# Patient Record
Sex: Female | Born: 1970 | Hispanic: No | Marital: Married | Smoking: Never smoker
Health system: Southern US, Community
[De-identification: ages and names within clinical notes are randomized; demographics above are authoritative.]

## PROBLEM LIST (undated history)

## (undated) ENCOUNTER — Emergency Department (HOSPITAL_COMMUNITY): Admission: EM | Discharge status: Absent without leave | Payer: 59

## (undated) DIAGNOSIS — Z9109 Other allergy status, other than to drugs and biological substances: Secondary | ICD-10-CM

## (undated) DIAGNOSIS — N951 Menopausal and female climacteric states: Secondary | ICD-10-CM

## (undated) DIAGNOSIS — E785 Hyperlipidemia, unspecified: Secondary | ICD-10-CM

## (undated) HISTORY — DX: Hyperlipidemia, unspecified: E78.5

## (undated) HISTORY — DX: Other allergy status, other than to drugs and biological substances: Z91.09

## (undated) HISTORY — DX: Menopausal and female climacteric states: N95.1

## (undated) HISTORY — PX: HERNIA REPAIR: SHX51

---

## 2002-01-31 ENCOUNTER — Emergency Department (HOSPITAL_COMMUNITY): Admission: EM | Admit: 2002-01-31 | Discharge: 2002-01-31 | Payer: Self-pay | Admitting: Emergency Medicine

## 2002-02-02 ENCOUNTER — Other Ambulatory Visit: Admission: RE | Admit: 2002-02-02 | Discharge: 2002-02-02 | Payer: Self-pay | Admitting: Obstetrics and Gynecology

## 2002-08-11 ENCOUNTER — Ambulatory Visit (HOSPITAL_COMMUNITY): Admission: RE | Admit: 2002-08-11 | Discharge: 2002-08-11 | Payer: Self-pay | Admitting: Obstetrics and Gynecology

## 2002-08-12 ENCOUNTER — Inpatient Hospital Stay (HOSPITAL_COMMUNITY): Admission: RE | Admit: 2002-08-12 | Discharge: 2002-08-14 | Payer: Self-pay | Admitting: Obstetrics and Gynecology

## 2003-12-13 ENCOUNTER — Other Ambulatory Visit: Admission: RE | Admit: 2003-12-13 | Discharge: 2003-12-13 | Payer: Self-pay

## 2006-10-03 ENCOUNTER — Inpatient Hospital Stay (HOSPITAL_COMMUNITY): Admission: RE | Admit: 2006-10-03 | Discharge: 2006-10-05 | Payer: Self-pay | Admitting: Obstetrics and Gynecology

## 2006-10-03 ENCOUNTER — Encounter (INDEPENDENT_AMBULATORY_CARE_PROVIDER_SITE_OTHER): Payer: Self-pay | Admitting: Specialist

## 2009-09-13 ENCOUNTER — Ambulatory Visit (HOSPITAL_COMMUNITY): Admission: RE | Admit: 2009-09-13 | Discharge: 2009-09-13 | Payer: Self-pay | Admitting: Family Medicine

## 2010-08-05 ENCOUNTER — Emergency Department (HOSPITAL_COMMUNITY): Admission: EM | Admit: 2010-08-05 | Discharge: 2010-08-05 | Payer: Self-pay | Admitting: Emergency Medicine

## 2011-05-03 NOTE — Op Note (Signed)
NAME:  LYNE, KHURANA         ACCOUNT NO.:  [id number]   MEDICAL RECORD NO.:  [id number]          PATIENT TYPE:  INP   LOCATION:  A412                          FACILITY:  [facility]   PHYSICIAN:  Tilda Burrow, M.D. DATE OF BIRTH:  1971-05-03   DATE OF PROCEDURE:  10/03/2006  DATE OF DISCHARGE:                                 OPERATIVE REPORT   PREOPERATIVE DIAGNOSES:  Pregnancy at 39 weeks, repeat cesarean section, not  for trial of labor, elective permanent sterilization, umbilical hernia.   POSTOPERATIVE DIAGNOSES:  Pregnancy at 39 weeks, repeat cesarean section,  not for trial of labor, elective permanent sterilization, umbilical hernia,  delivered and with umbilical hernia repaired.   PROCEDURE:  Repeat repeat low transverse cervical cesarean section,  bilateral partial salpingectomy and umbilical herniorrhaphy.   SURGEON:  Tilda Burrow, M.D.   ASSISTANTAlinda Money RN   ANESTHESIA:  Spinal.   COMPLICATIONS:  None.   FINDINGS:  Healthy female infant, Apgars 9 and 9.   INDICATIONS:  A 40 year old multiparous Hispanic female with repeat cesarean  section scheduled at term.   DETAILS OF PROCEDURE:  The patient was taken operating room.  Spinal  anesthesia introduced.  Lower abdomen prepped and draped for surgery with  Pfannenstiel incision repeated with wide excision of cicatrix to address the  old scar.  The abdomen was easily entered through the midline.  Bladder flap  developed on the lower uterine segment which was quite thin at the lower  portions of the bladder flap.  Retraction and transverse uterine incision  performed, extended laterally using index finger traction with rotation of  the vertex into the incision and delivered the infant using fundal pressure.  Infant delivered nicely.  Cord was clamped and infant placed in Dr.  Samul Dada .  The cord blood gases were obtained as well as routine samples  then the uterus irrigated with saline solution and closed  with a running  locking closure.  The patient received Cleocin 300 mg IV for prophylaxis.  The bladder flap was satisfactorily hemostatic and so we closed the bladder  flap with 2-0 chromic.   Tubal ligation:  Tubal ligation was performed by identifying a mid segment  knuckle of each tube, doubly ligating around its pedicle, excising the  incarcerated knuckle of tube for histology.  Each tube was sent separately  and the left tube tagged for future identification.   Peritoneal closure then followed with sponge and needle counts correct.  The  fascia was trimmed to improve the lower abdominal contours of approximately  1 inch of extra tissues and then the fascia closed with a running locking  zero Vicryl.  The subcutaneous tissues easily reapproximated with  interrupted 2-0 plain sutures.  There was no bleeding in this area.  Staple  closure of skin completed the procedure.   Umbilical herniorrhaphy.  Umbilical hernia repair was then performed by  identifying the skin over the hernia, pulling the umbilicus caudad, making a  semicircular incision around the bottom one-third of the umbilicus,  dissecting down and identifying the rim of the hernia defect.  We did not  actually  enter the peritoneal cavity.  Two Allis clamps were used on the  upper and lower aspects of the hernia defect.  The lateral aspects of the  hernia defect were trimmed to allow a more uniform closure and then we  closed the close the defect with a running 2-0 Prolene pulled together with  a continuous running fashion without difficulty.  The tips of the suture  were inverted underneath the fascia so that we did not deal with umbilical  skin irritation.  Subcutaneous tissues were reapproximated using 2-0 chromic  after the skin edges were trimmed for improved appearance and then staple  closure of the skin completed the procedure.  The patient the tolerated  procedure well and went to the recovery room in good  condition.  Estimated  blood loss 500 mL.      Tilda Burrow, M.D.  Electronically Signed     JVF/MEDQ  D:  10/03/2006  T:  10/05/2006  Job:  [id number]

## 2011-05-03 NOTE — Discharge Summary (Signed)
NAME:  Mary Gallagher, Mary Gallagher         ACCOUNT NO.:  [id number]   MEDICAL RECORD NO.:  [id number]          PATIENT TYPE:  INP   LOCATION:  A412                          FACILITY:  [facility]   PHYSICIAN:  Lazaro Arms, M.D.   DATE OF BIRTH:  02-24-1971   DATE OF ADMISSION:  10/03/2006  DATE OF DISCHARGE:  10/21/2007LH                                 DISCHARGE SUMMARY   DISCHARGE DIAGNOSES:  1. Status post a repeat cesarean section with bilateral tubal ligation,      repair of umbilical hernia.  2. Unremarkable postoperative course.   PROCEDURES:  As above.   Please refer to the history and physical and the operative report for  details on admission to the hospital.   HOSPITAL COURSE:  The patient was admitted.  After surgery, she did quite  well.  She tolerated clear liquids and a regular diet.  She voided without  symptoms.  She was extensively ambulatory.  Her incision was clean, dry, and  intact.  Her postoperative day one hemoglobin was 12 and 34.  She had  already transitioned to oral pain medicine.  She was discharged to home on  the morning of postoperative day two in good, stable condition, to follow up  in the office on Friday to have her staples of her cesarean and umbilical  hernia repair removed.  She was given Tylox #30 and Motrin #20 to take home  for pain management.  She was given instructions and precautions for calling  back prior to her scheduled visit.      Lazaro Arms, M.D.  Electronically Signed     LHE/MEDQ  D:  10/05/2006  T:  10/05/2006  Job:  [id number]

## 2011-05-03 NOTE — H&P (Signed)
NAMEMARYLYNN, Mary Gallagher         ACCOUNT NO.:  [id number]   MEDICAL RECORD NO.:  [id number]          PATIENT TYPE:  INP   LOCATION:                                FACILITY:  [facility]   PHYSICIAN:  Tilda Burrow, M.D. DATE OF BIRTH:  1971/06/16   DATE OF ADMISSION:  DATE OF DISCHARGE:  LH                                HISTORY & PHYSICAL   ADMISSION DIAGNOSES:  1. Pregnancy, 38-1/2 weeks' gestation.  2. Repeat cesarean section after trial of labor.  3. Desire for elective permanent sterilization.  4. Umbilical hernia.   HISTORY OF PRESENT ILLNESS:  This 40 year old female, gravida 2, para 1, AB  0, LMP February 02, 2006, placing [facility] November 09, 2006, placing with  ultrasound, assigned [facility] matching also on October 25 and October 28 and  October 14, 2006, was admitted at 39 weeks by menstrual criteria and 38+ by  ultrasound criteria for repeat cesarean section and tubal ligation with  additional plans for umbilical hernia.  She has been seen in our office  through pregnancy with initial visit at 8 weeks' gestation.  Prenatal course  was relatively uneventful.  She does have an umbilical hernia that is  symptomatic and she wishes corrected.  She has also specifically stated  desire for permanent sterilization.  She understands that it is permanent,  with discussion in both [place] and [place].   PAST MEDICAL HISTORY:  Benign.  Surgically-assisted Cesarean.   ALLERGIES:  1. PENICILLIN.  2. TYLOX.   SOCIAL HISTORY:  She lives with the baby's father, Mary Gallagher.   PHYSICAL EXAMINATION:  GENERAL:  Shows a healthy, slim, Hispanic female.  VITAL SIGNS:  Weight 153, blood pressure 140/60.  ABDOMEN:  Fundal height term, fetus 6 pound estimated fetal weight with  small 2-3 cm umbilical hernia.  Lower abdominal incision is well-healed.  GENITALIA:  External genitalia normal.   LABORATORY DATA:  For pregnancy, include blood type A positive.  Urine drug  screen negative.  Rubella  immune.  Hemoglobin 13, hematocrit 38.  Hepatitis,  HIV, RPR, and GC/Chlamydia were negative.  Group B Strep negative.  _Glucose  Tol Test__ negative.   PLAN:  Repeat C-section, tubal ligation, umbilical hernia on October 03, 2006.      Tilda Burrow, M.D.  Electronically Signed     JVF/MEDQ  D:  10/02/2006  T:  10/03/2006  Job:  [id number]

## 2011-05-03 NOTE — Op Note (Signed)
NAME:  Mary Gallagher, Mary Gallagher         ACCOUNT NO.:  [id number]   MEDICAL RECORD NO.:  [id number]          PATIENT TYPE:  INP   LOCATION:  A412                          FACILITY:  [facility]   PHYSICIAN:  Tilda Burrow, M.D. DATE OF BIRTH:  05/23/1971   DATE OF PROCEDURE:  10/03/2006  DATE OF DISCHARGE:  10/05/2006                               OPERATIVE REPORT   PREOPERATIVE DIAGNOSIS:  Pregnancy at 38-1/[redacted] weeks gestation.  Repeat  cesarean section, not for trial of labor.  Desire for elective permanent  sterilization.  Umbilical hernia.   POSTOPERATIVE DIAGNOSIS:  Pregnancy at 38-1/[redacted] weeks gestation.  Repeat  cesarean section, not for trial of labor.  Desire for elective permanent  sterilization.  Umbilical hernia.   PROCEDURE:  Repeat low transverse cervical cesarean section, bilateral  tubal ligation, umbilical herniorrhaphy.   SURGEON:  Tilda Burrow, M.D.   ASSISTANT:  None.   ANESTHESIA:  Spinal.   COMPLICATIONS:  None.   FINDINGS:  Healthy infant.  Apgars 9 and 9.   DESCRIPTION OF PROCEDURE:  Patient was taken to the operating room.  Anesthesia introduced, spinal.  Abdomen prepped and draped.  A  transverse lower abdominal incision was repeated in the method of  Pfannenstiel with wide excision of the old cicatrix.  The peritoneal  cavity was entered in the midline.  Bladder flap developed.  A  transverse lower uterine segment incision made.  Fetal vertex rotated  through the incision and delivered easily with fundal pressure.  The  cord was clamped, and the infant passed to the waiting pediatrician, Dr.  Milford Cage.   The uterus was irrigated with antibiotic solution and closed in a single-  layer, running, locking closure of 2-0 chromic, and bladder flap  reapproximated with 2-0 chromic.   TUBAL LIGATION:  The tubal ligation was performed by incarcerated a mid  segment knuckle of tube with 2-0 chromic suture and exercising the  incarcerated knuckle of tube.  Hemostasis  was good.  Specimens were  tagged for separate identification.   The procedure was continued by closing the anterior peritoneum, then  trimming the fascia slightly, pulling it together with continuous,  running 0 Vicryl.  Closing the subcutaneous fatty space, potential space  with 2-0 plain and staple closure of the skin.  The patient tolerated  the procedure well.   ADDENDUM:  The umbilical hernia was then addressed.  We first made a  semi-circular incision over the inferior portions of the incision from 4  o'clock position to 8 o'clock, dissected down to the fascial defect, and  inverted the hernia sac down into the abdomen.  It was not necessary to  open the hernia sac.  The fascia was pulled together side-to-side using  continuous running 2-0 Prolene suture with good approximation of the  fascial edges.  The subcutaneous tissue was pulled together with  interrupted 3-0 plain, and simple closure of the skin completed the  procedure, as staples had closed the Pfannenstiel incision.  Patient  tolerated the procedure well, went to the recovery room in stable  condition.      Tilda Burrow, M.D.  Electronically  Signed     JVF/MEDQ  D:  11/05/2006  T:  11/05/2006  Job:  [id number]

## 2011-05-03 NOTE — Discharge Summary (Signed)
   NAME:  Mary, Gallagher                       ACCOUNT NO.:  [id number]   MEDICAL RECORD NO.:  [id number]                   PATIENT TYPE:  INP   LOCATION:  A417                                 FACILITY:  [facility]   PHYSICIAN:  Lazaro Arms, M.D.                DATE OF BIRTH:  04/04/1971   DATE OF ADMISSION:  08/12/2002  DATE OF DISCHARGE:  08/14/2002                                 DISCHARGE SUMMARY   DISCHARGE DIAGNOSES:  1. Status post a primary low transverse cesarean section.  2. Breech presentation.  3. Labor.   PROCEDURE:  As above.   HISTORY OF PRESENT ILLNESS:  Please refer to the history and physical for  details of admission to the hospital.   HOSPITAL COURSE:  The patient was admitted in early labor.  She was breech,  supposed to undergo a cesarean section the following day as scheduled, but  came in in labor.  She delivered an 8-pound 13-ounce female.  Surgery was  unremarkable.  Her postoperative course was unremarkable.  She remained  afebrile, was tolerating clear liquids and a regular diet.  Voided without  symptoms.  Tolerated transition from IV to oral pain medicine.  She was  ambulatory.  Her incision was clean, dry, and intact.  Her H&H was 10.9 and  38.1.  she was discharged to home on the morning of postoperative day #2 in  good and stable condition.  To follow up in the office on Wednesday to have  her staples removed.                                               Lazaro Arms, M.D.    [name]  D:  08/14/2002  T:  08/14/2002  Job:  [id number]

## 2011-05-03 NOTE — Op Note (Signed)
NAME:  Mary Gallagher, Mary Gallagher                       ACCOUNT NO.:  [id number]   MEDICAL RECORD NO.:  [id number]                   PATIENT TYPE:  INP   LOCATION:  A417                                 FACILITY:  [facility]   PHYSICIAN:  Tilda Burrow, M.D.              DATE OF BIRTH:  01/07/1971   DATE OF PROCEDURE:  08/13/2002  DATE OF DISCHARGE:                                 OPERATIVE REPORT   PREOPERATIVE DIAGNOSIS:  Pregnancy [redacted] weeks gestation, early labor, breech  presentation.   POSTOPERATIVE DIAGNOSIS:  Pregnancy [redacted] weeks gestation, early labor, breech  presentation.   PROCEDURE:  Primary low transverse cervical cesarean section.   SURGEON:  Tilda Burrow, M.D.   ASSISTANT:  None.   ANESTHESIA:  Spinal.   COMPLICATIONS:  None.   PEDIATRICIAN:  Francoise Schaumann. Halm, D.O.   FINDINGS:  8 pound ____ ounce female infant, Apgars 8 and 9.   DESCRIPTION OF PROCEDURE:  The patient was taken to the operating room and  spinal anesthesia introduced.  The abdomen was prepped and draped with a  Foley catheter in place with a Foley catheter in place.  A Pfannenstiel-type  incision was performed once analgesia was confirmed and the easy development  of the bladder flap was achieved.   A transverse nick was made in the uterus during the thinned out lower  uterine segment.  The cut was over the area of the buttock.  There was a  minimal contact with the infant enough to allow a droplet of blood from the  thigh without breaking the infant's skin completely.  The delivery of the  infant was performed by delivery of the buttocks applying fundal pressure  and delivering the legs up to the knees which the hips could be flexed and  the legs delivered without extension.   The body was delivered up to the shoulders, whereupon the arms were swept in  front of the body without difficulty, and then the Mauriceau maneuver used  to guide the vertex out with the finger in the infant's mouth to guide  the  vertex.  Bulb suctioning was performed promptly upon access to the pharynx,  and actually it was clear.  The infant's cord was clamped and the infant  passed to the waiting pediatrician.  The infant's care is dictated by Dr.  Milford Cage.   Cord blood samples were obtained and placenta delivered [facility]  presentation.  The patient received IV antibiotics consisting of Cleocin  intravenously administered due to her penicillin allergy.   The uterus was irrigated with saline solution.  A single layer of running  locking closure performed and then 2-0 chromic closure of the bladder flap  performed.  The peritoneum was closed using 2-0 chromic as well after  irrigation of the abdomen.  The fascia was closed using #0 Vicryl in a  running fashion.  Subcutaneous tissues were reapproximated using interrupted  2-0 plain and  then staple clips for the skin completed the procedure.  The  patient tolerated the procedure well and went to recovery room in good  condition with estimated blood loss 400 cc.                                               Tilda Burrow, M.D.    JVF/MEDQ  D:  08/12/2002  T:  08/14/2002  Job:  [id number]   cc:   [facility] Department   Francoise Schaumann. Halm, D.O.

## 2011-05-08 ENCOUNTER — Other Ambulatory Visit (HOSPITAL_COMMUNITY): Payer: Self-pay | Admitting: Family Medicine

## 2011-05-08 DIAGNOSIS — Z139 Encounter for screening, unspecified: Secondary | ICD-10-CM

## 2011-05-10 ENCOUNTER — Ambulatory Visit (HOSPITAL_COMMUNITY)
Admission: RE | Admit: 2011-05-10 | Discharge: 2011-05-10 | Disposition: A | Discharge status: Home / Self Care | Payer: Self-pay | Source: Ambulatory Visit | Attending: Family Medicine | Admitting: Family Medicine

## 2011-05-10 DIAGNOSIS — Z139 Encounter for screening, unspecified: Secondary | ICD-10-CM

## 2012-09-28 ENCOUNTER — Other Ambulatory Visit (HOSPITAL_COMMUNITY): Payer: Self-pay | Admitting: *Deleted

## 2012-09-28 DIAGNOSIS — Z139 Encounter for screening, unspecified: Secondary | ICD-10-CM

## 2012-10-05 ENCOUNTER — Ambulatory Visit (HOSPITAL_COMMUNITY)
Admission: RE | Admit: 2012-10-05 | Discharge: 2012-10-05 | Disposition: A | Discharge status: Home / Self Care | Payer: PRIVATE HEALTH INSURANCE | Source: Ambulatory Visit | Attending: *Deleted | Admitting: *Deleted

## 2012-10-05 DIAGNOSIS — Z139 Encounter for screening, unspecified: Secondary | ICD-10-CM

## 2012-10-05 DIAGNOSIS — Z1231 Encounter for screening mammogram for malignant neoplasm of breast: Secondary | ICD-10-CM | POA: Insufficient documentation

## 2013-11-16 ENCOUNTER — Other Ambulatory Visit (HOSPITAL_COMMUNITY): Payer: Self-pay | Admitting: Physician Assistant

## 2013-11-16 DIAGNOSIS — Z139 Encounter for screening, unspecified: Secondary | ICD-10-CM

## 2013-11-30 ENCOUNTER — Ambulatory Visit (HOSPITAL_COMMUNITY)
Admission: RE | Admit: 2013-11-30 | Discharge: 2013-11-30 | Disposition: A | Discharge status: Home / Self Care | Payer: Self-pay | Source: Ambulatory Visit | Attending: Physician Assistant | Admitting: Physician Assistant

## 2013-11-30 DIAGNOSIS — Z139 Encounter for screening, unspecified: Secondary | ICD-10-CM

## 2014-12-26 ENCOUNTER — Other Ambulatory Visit (HOSPITAL_COMMUNITY): Payer: Self-pay | Admitting: Physician Assistant

## 2014-12-26 DIAGNOSIS — Z1231 Encounter for screening mammogram for malignant neoplasm of breast: Secondary | ICD-10-CM

## 2015-01-12 ENCOUNTER — Ambulatory Visit (HOSPITAL_COMMUNITY)
Admission: RE | Admit: 2015-01-12 | Discharge: 2015-01-12 | Disposition: A | Discharge status: Home / Self Care | Payer: Self-pay | Source: Ambulatory Visit | Attending: Physician Assistant | Admitting: Physician Assistant

## 2015-01-12 DIAGNOSIS — Z1231 Encounter for screening mammogram for malignant neoplasm of breast: Secondary | ICD-10-CM

## 2015-01-13 ENCOUNTER — Other Ambulatory Visit: Payer: Self-pay | Admitting: Physician Assistant

## 2015-01-13 DIAGNOSIS — R928 Other abnormal and inconclusive findings on diagnostic imaging of breast: Secondary | ICD-10-CM

## 2015-01-31 ENCOUNTER — Ambulatory Visit (HOSPITAL_COMMUNITY)
Admission: RE | Admit: 2015-01-31 | Discharge: 2015-01-31 | Disposition: A | Discharge status: Home / Self Care | Payer: 59 | Source: Ambulatory Visit | Attending: Physician Assistant | Admitting: Physician Assistant

## 2015-01-31 DIAGNOSIS — R928 Other abnormal and inconclusive findings on diagnostic imaging of breast: Secondary | ICD-10-CM | POA: Insufficient documentation

## 2015-02-07 ENCOUNTER — Encounter (HOSPITAL_COMMUNITY): Payer: Self-pay

## 2015-08-02 IMAGING — MG MM DIGITAL SCREENING
3 series · 3 of 3 positions shown · non-contrast
Comparison: Previous exam(s).

CLINICAL DATA: Screening.

EXAM:
DIGITAL SCREENING BILATERAL MAMMOGRAM WITH CAD

[L CC]
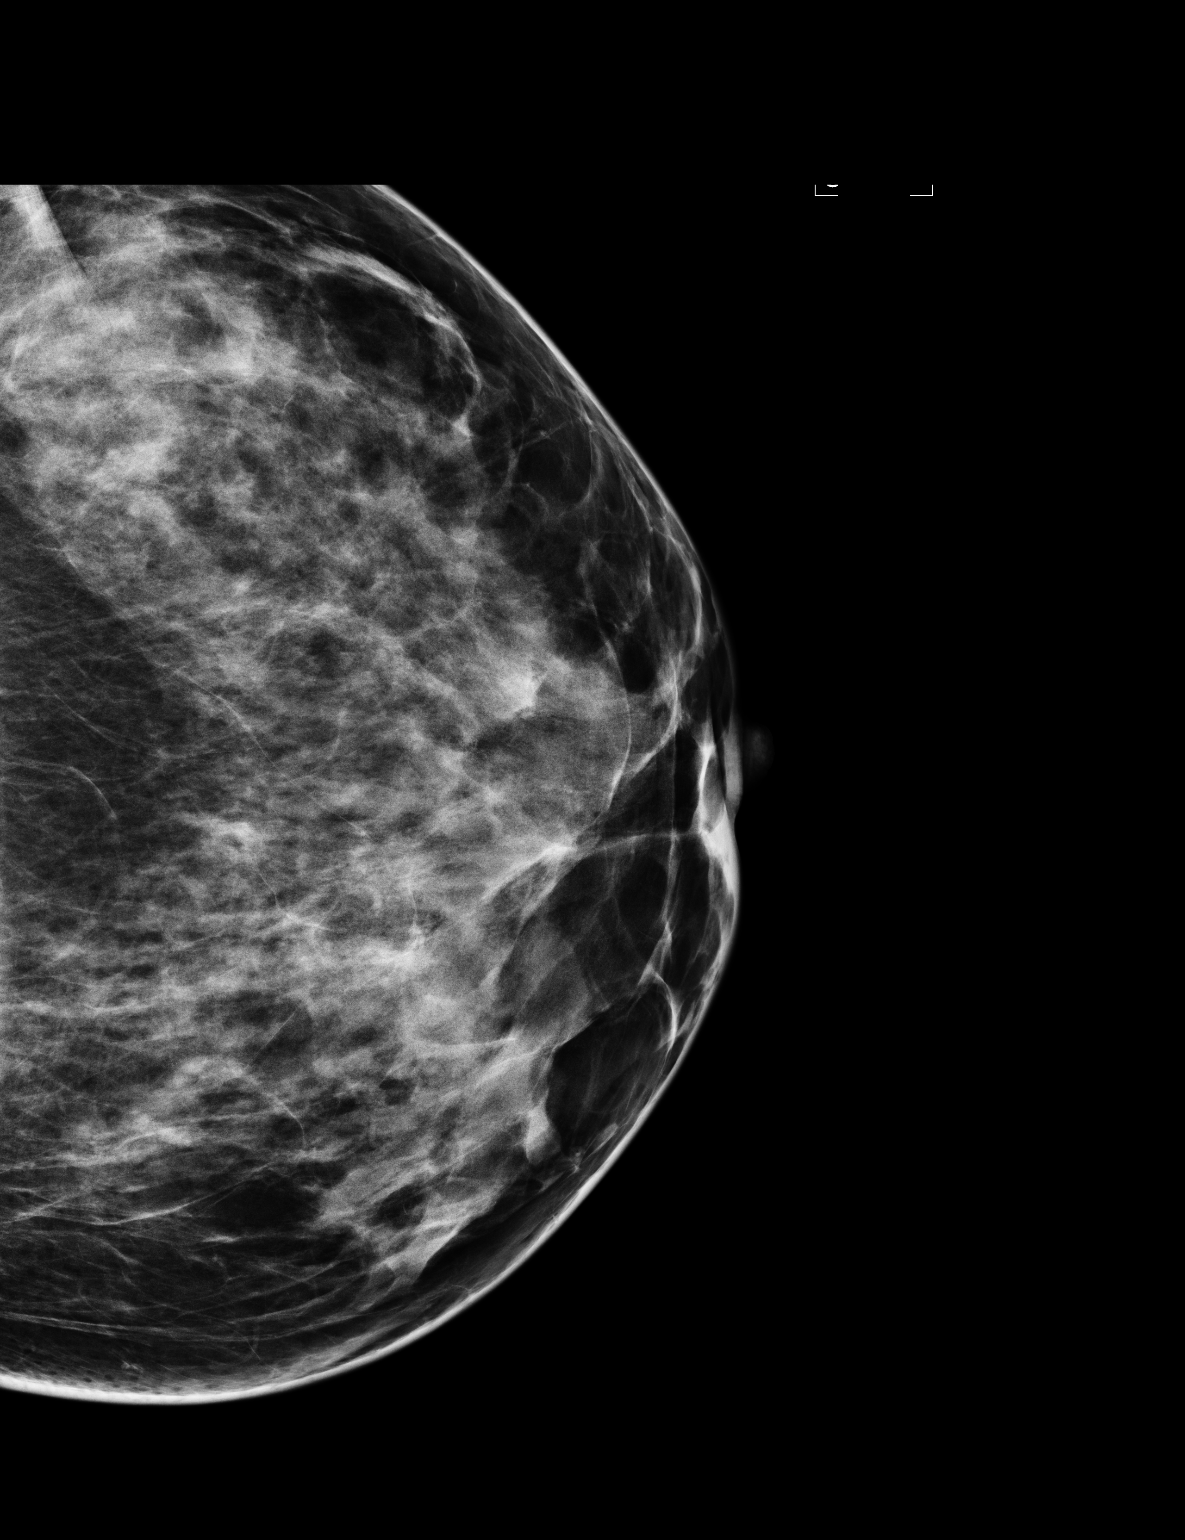

[R CC]
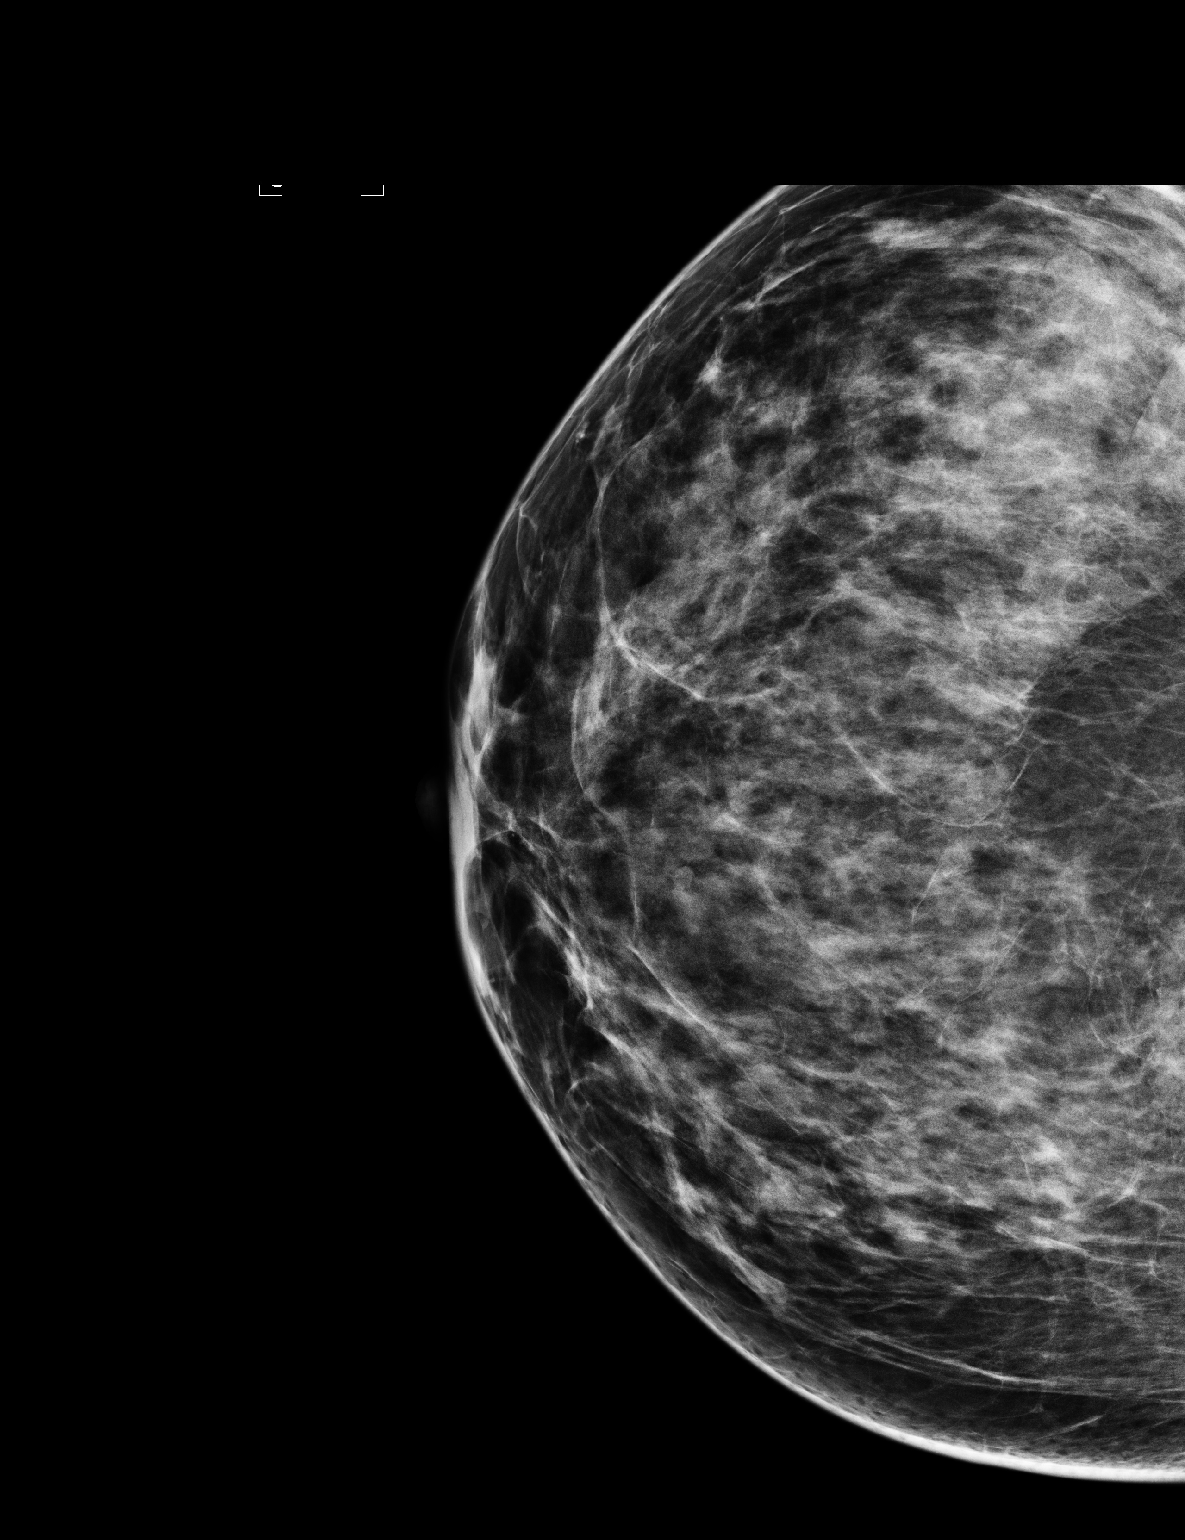

[R MLO]
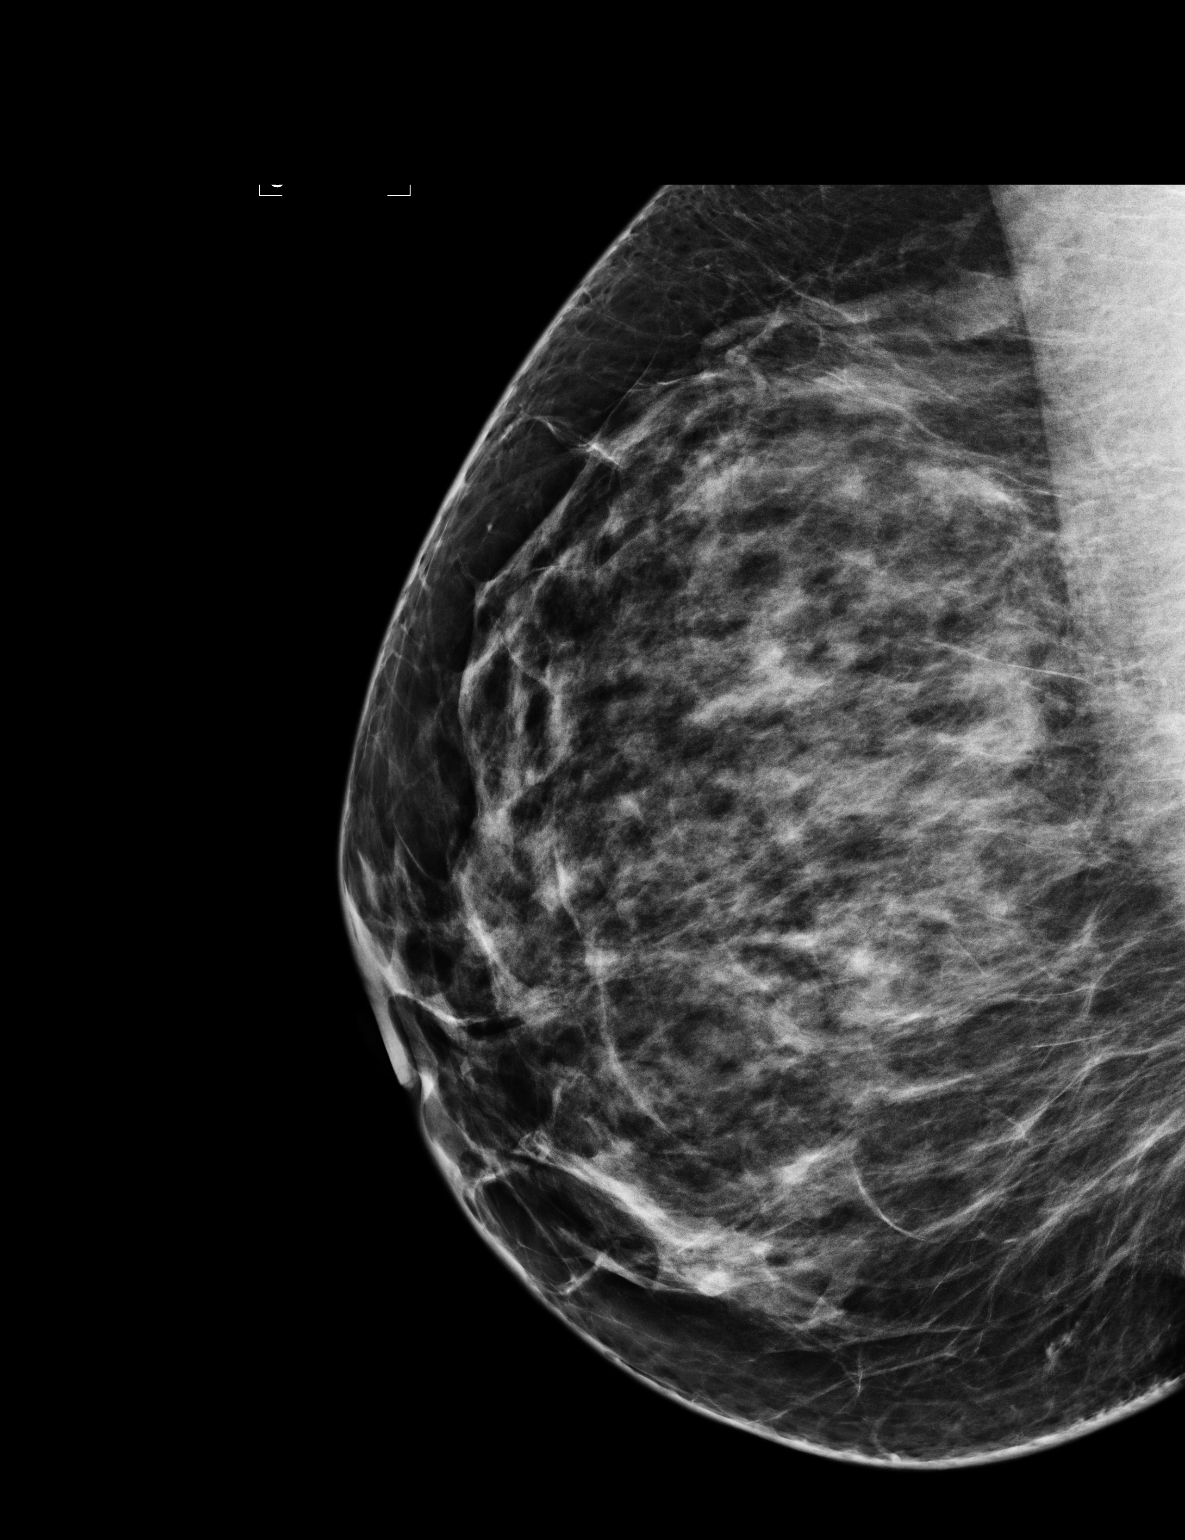

[3 of 3 positions shown; findings below may reference images not displayed]

ACR Breast Density Category c: The breasts are heterogeneously
dense, which may obscure small masses.
FINDINGS: There are no findings suspicious for malignancy. Images were
processed with CAD.
IMPRESSION: No mammographic evidence of malignancy. A result letter of this
screening mammogram will be mailed directly to the patient.

RECOMMENDATION:
Screening mammogram in one year. (Code:[id number]-8X6)

BI-RADS CATEGORY  1: Negative

## 2016-03-28 ENCOUNTER — Other Ambulatory Visit (HOSPITAL_COMMUNITY)
Admission: RE | Admit: 2016-03-28 | Discharge: 2016-03-28 | Disposition: A | Discharge status: Home / Self Care | Payer: No Typology Code available for payment source | Source: Ambulatory Visit | Attending: Adult Health | Admitting: Adult Health

## 2016-03-28 ENCOUNTER — Encounter: Payer: Self-pay | Admitting: Adult Health

## 2016-03-28 ENCOUNTER — Ambulatory Visit (INDEPENDENT_AMBULATORY_CARE_PROVIDER_SITE_OTHER): Payer: No Typology Code available for payment source | Admitting: Adult Health

## 2016-03-28 VITALS — BP 110/80 | HR 64 | Ht 59.0 in | Wt 155.5 lb

## 2016-03-28 DIAGNOSIS — Z1151 Encounter for screening for human papillomavirus (HPV): Secondary | ICD-10-CM | POA: Diagnosis present

## 2016-03-28 DIAGNOSIS — Z113 Encounter for screening for infections with a predominantly sexual mode of transmission: Secondary | ICD-10-CM | POA: Insufficient documentation

## 2016-03-28 DIAGNOSIS — N951 Menopausal and female climacteric states: Secondary | ICD-10-CM

## 2016-03-28 DIAGNOSIS — Z01419 Encounter for gynecological examination (general) (routine) without abnormal findings: Secondary | ICD-10-CM | POA: Insufficient documentation

## 2016-03-28 DIAGNOSIS — Z9109 Other allergy status, other than to drugs and biological substances: Secondary | ICD-10-CM | POA: Insufficient documentation

## 2016-03-28 DIAGNOSIS — Z1212 Encounter for screening for malignant neoplasm of rectum: Secondary | ICD-10-CM

## 2016-03-28 HISTORY — DX: Other allergy status, other than to drugs and biological substances: Z91.09

## 2016-03-28 HISTORY — DX: Menopausal and female climacteric states: N95.1

## 2016-03-28 LAB — HEMOCCULT GUIAC POC 1CARD (OFFICE): Fecal Occult Blood, POC: NEGATIVE

## 2016-03-28 NOTE — Patient Instructions (Addendum)
Physical in 1 year, pap in 3 years Mammogram yearly Try claritin or zyrtec  Perimenopausia (Perimenopause) La perimenopausia es el momento en que su cuerpo comienza a pasar a la menopausia (sin menstruacin durante 12 meses consecutivos). Es un proceso natural. La perimenopausia puede comenzar entre 2 y 8 aos antes de la menopausia y por lo general tiene una duracin de 1 ao ms pasada la menopausia. [employer], los ovarios podran producir un vulo o no. Los ovarios varan su produccin de las hormonas estrgeno y Psychiatric nurse. Esto puede causar perodos menstruales irregulares, dificultad para quedar embarazada, hemorragia vaginal entre perodos y sntomas incmodos. CAUSAS  Produccin irregular de las hormonas ovricas estrgeno y Education officer, museum, y no ovular todos los meses.  Otras causas son:  Tumor de la glndula pituitaria.  Enfermedades que [employer] ovarios.  Radioterapia.  Quimioterapia.  Causas desconocidas.  Fumar mucho y abusar del consumo de alcohol puede llevar a que la perimenopausia aparezca antes. SIGNOS Y SNTOMAS   Acaloramiento.  Sudoracin nocturna.  Perodos menstruales irregulares.  Disminucin del deseo sexual.  Sequedad vaginal.  Dolores de cabeza.  Cambios en el estado de nimo.  Depresin.  Problemas de memoria.  Irritabilidad.  Cansancio.  Aumento de [place].  Problemas para quedar embarazada.  Prdida de clulas seas (osteoporosis).  Comienzo de endurecimiento de las arterias (aterosclerosis). DIAGNSTICO  El mdico realizar un diagnstico en funcin de su edad, historial de perodos menstruales y sntomas. Le realizarn un examen fsico para ver si hay algn cambio en su cuerpo, en especial en sus rganos reproductores. Las pruebas hormonales pueden ser o no tiles segn la cantidad de hormonas femeninas que produzca y [employer] produzca. Sin embargo, podrn Tree surgeon pruebas hormonales para Soil scientist. TRATAMIENTO  En algunos casos, no se necesita tratamiento. La decisin acerca de qu tratamiento es necesario durante la perimenopausia deber realizarse en conjunto con su mdico segn cmo estn afectando los sntomas a su estilo de vida. Existen varios tratamientos disponibles, como:  Warehouse manager cada sntoma individual con medicamentos especficos para ese sntoma.  Algunos medicamentos herbales pueden ayudar en sntomas especficos.  Psicoterapia.  Terapia grupal. INSTRUCCIONES PARA EL CUIDADO EN EL HOGAR   Controle sus periodos menstruales (cundo ocurren, qu tan abundantes son, cunto tiempo pasa entre perodos, y cunto duran) como tambin sus sntomas y cundo comenzaron.  Tome slo medicamentos de venta libre o recetados, segn las indicaciones del mdico.  Duerma y descanse.  Haga actividad fsica.  Consuma una dieta que contenga calcio (bueno para los [place]) y productos derivados de la soja (actan como estrgenos).  No fume.  Evite las bebidas alcohlicas.  Tome los suplementos vitamnicos segn las indicaciones del mdico. En ciertos casos, puede ser de [place] tomar vitamina E.  Tome suplementos de calcio y vitamina D para ayudar a Research scientist (medical) prdida sea.  En algunos casos la terapia de grupo podr ayudarla.  La acupuntura puede ser de ayuda en ciertos casos. SOLICITE ATENCIN MDICA SI:   Tiene preguntas acerca de sus sntomas.  Necesita ser derivada a un especialista (gineclogo, psiquiatra, o psiclogo). SOLICITE ATENCIN MDICA DE INMEDIATO SI:   Sufre una hemorragia vaginal abundante.  Su perodo menstrual dura ms de 8 das.  Sus perodos son recurrentes cada menos de [address]  Tiene hemorragias durante las [employer].  Est muy deprimido.  Siente dolor al [employer].  Siente dolor de cabeza intenso.  Tiene problemas de visin.   Esta informacin no tiene Theme park manager  el consejo del mdico. Asegrese de hacerle al  mdico cualquier pregunta que tenga.   Document Released: 12/02/2005 Document Revised: 09/22/2013 Elsevier Interactive Patient Education [employer]2016 Elsevier Inc.

## 2016-03-28 NOTE — Progress Notes (Addendum)
Patient ID: Mary Gallagher D Babich, female   DOB: 04/03/1971, 45 y.o.   MRN: [id number] History of Present Illness: Mary Gallagher is a 45 year old Hispanic female, married in for well woman gyn exam and pap and wants STD testing.She complains of runny nose, thinks its allergies.She is having irregular periods, may skip one. She says she has had labs and they were normal.  Current Medications, Allergies, Past Medical History, Past Surgical History, Family History and Social History were reviewed in [facility] electronic medical record.     Review of Systems: Patient denies any daily headaches, hearing loss, fatigue, blurred vision, shortness of breath, chest pain, abdominal pain, problems with bowel movements, urination, or intercourse. No joint pain or mood swings.See HPI for positives.    Physical Exam:BP 110/80 mmHg  Pulse 64  Ht 4\' 11"  (1.499 m)  Wt 155 lb 8 oz (70.534 kg)  BMI 31.39 kg/m2  LMP 02/21/2016 (Approximate) General:  Well developed, well nourished, no acute distress Skin:  Warm and dry Neck:  Midline trachea, normal thyroid, good ROM, no lymphadenopathy Lungs; Clear to auscultation bilaterally Breast:  No dominant palpable mass, retraction, or nipple discharge Cardiovascular: Regular rate and rhythm Abdomen:  Soft, non tender, no hepatosplenomegaly Pelvic:  External genitalia is normal in appearance, no lesions.  The vagina is normal in appearance. Urethra has no lesions or masses. The cervix is bulbous. Pap with GC/CHL and HPV performed. Uterus is felt to be normal size, shape, and contour.  No adnexal masses or tenderness noted.Bladder is non tender, no masses felt. Rectal: Good sphincter tone, no polyps, or hemorrhoids felt.  Hemoccult negative. Extremities/musculoskeletal:  No swelling or varicosities noted, no clubbing or cyanosis Psych:  No mood changes, alert and cooperative,seems happy Discussed peri menopause symptoms.  Impression: Well woman gyn exam and  pap Allergies STD screening  Peri menopausal     Plan: Check HIV,RPR and HSV2 Physical in 1 year, pap in 3 if normal Mammogram yearly Review handout on peri menopause Try Claritin or zyrtec, allegra or xyzal for allergies

## 2016-03-29 LAB — HIV ANTIBODY (ROUTINE TESTING W REFLEX): HIV SCREEN 4TH GENERATION: NONREACTIVE

## 2016-03-29 LAB — HSV 2 ANTIBODY, IGG

## 2016-03-29 LAB — RPR: RPR: NONREACTIVE

## 2016-04-01 LAB — CYTOLOGY - PAP

## 2016-10-02 IMAGING — MG MM DIGITAL DIAGNOSTIC UNILAT R
2 series · 2 of 2 positions shown · non-contrast
Comparison: Previous mammograms.

CLINICAL DATA: Screening recall for an asymmetry seen in the upper
posterior right breast on the MLO view only.

EXAM:
DIGITAL DIAGNOSTIC RIGHT MAMMOGRAM WITH CAD

[R MLO]
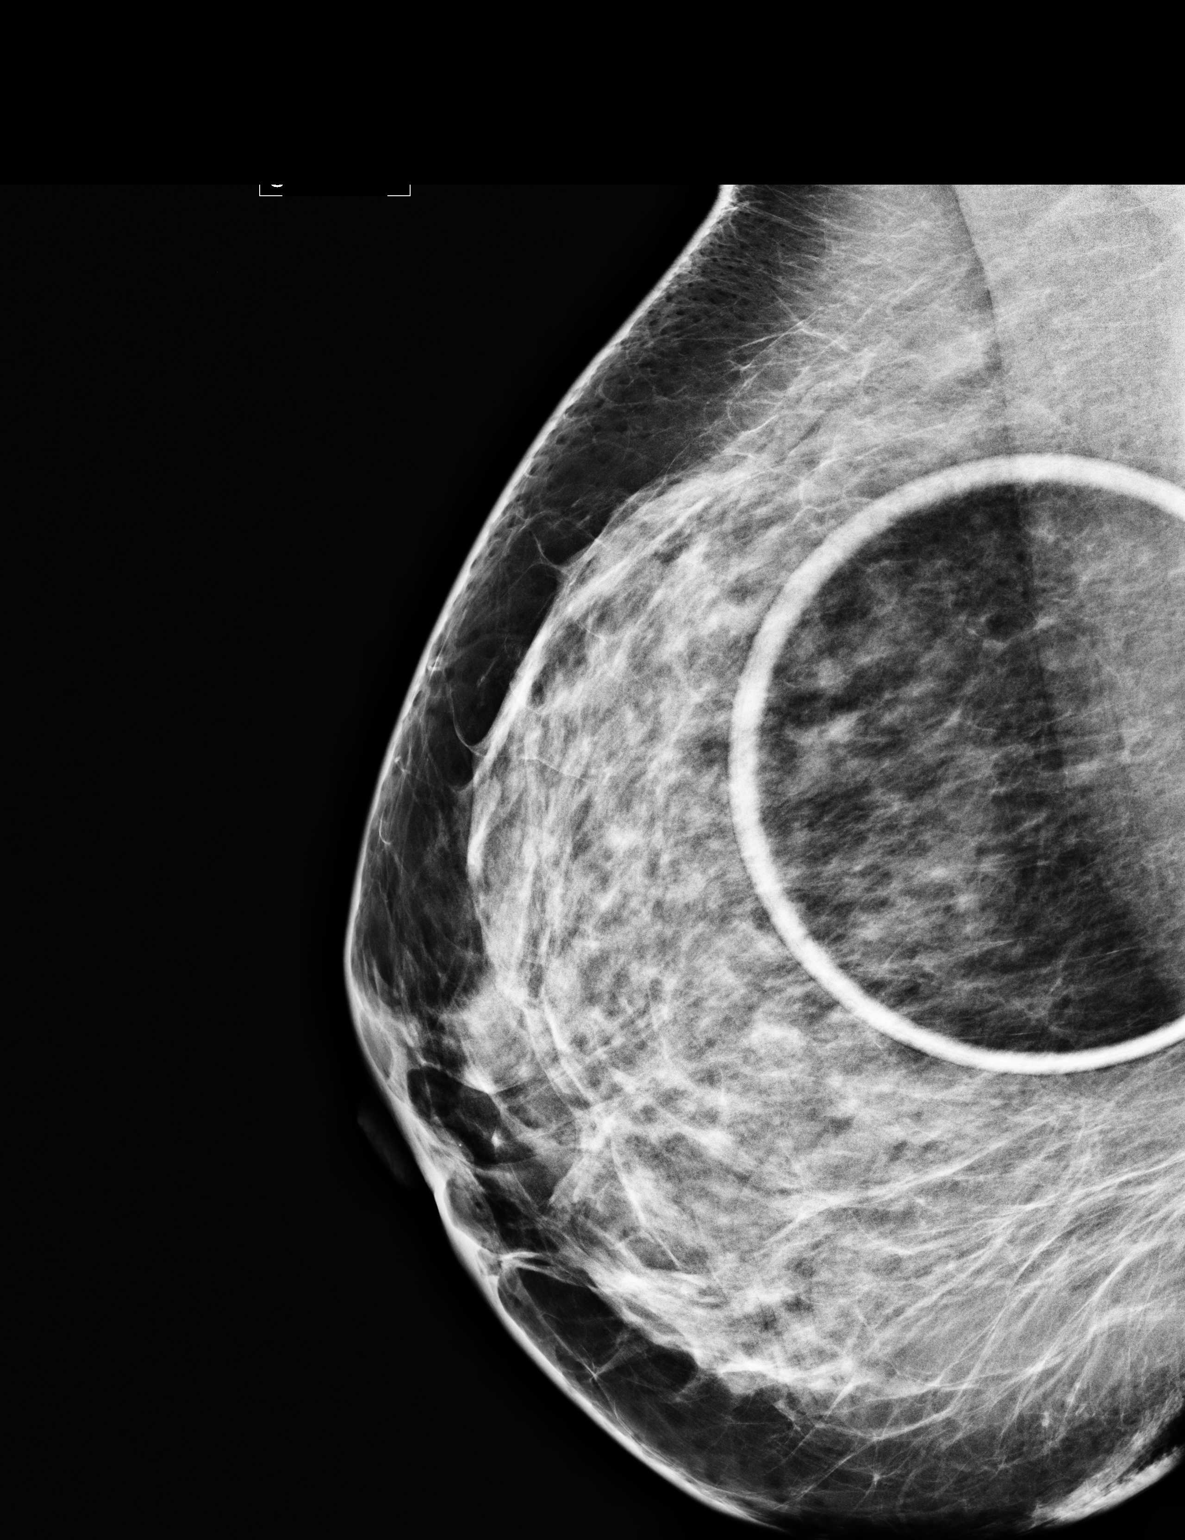

[R ML]
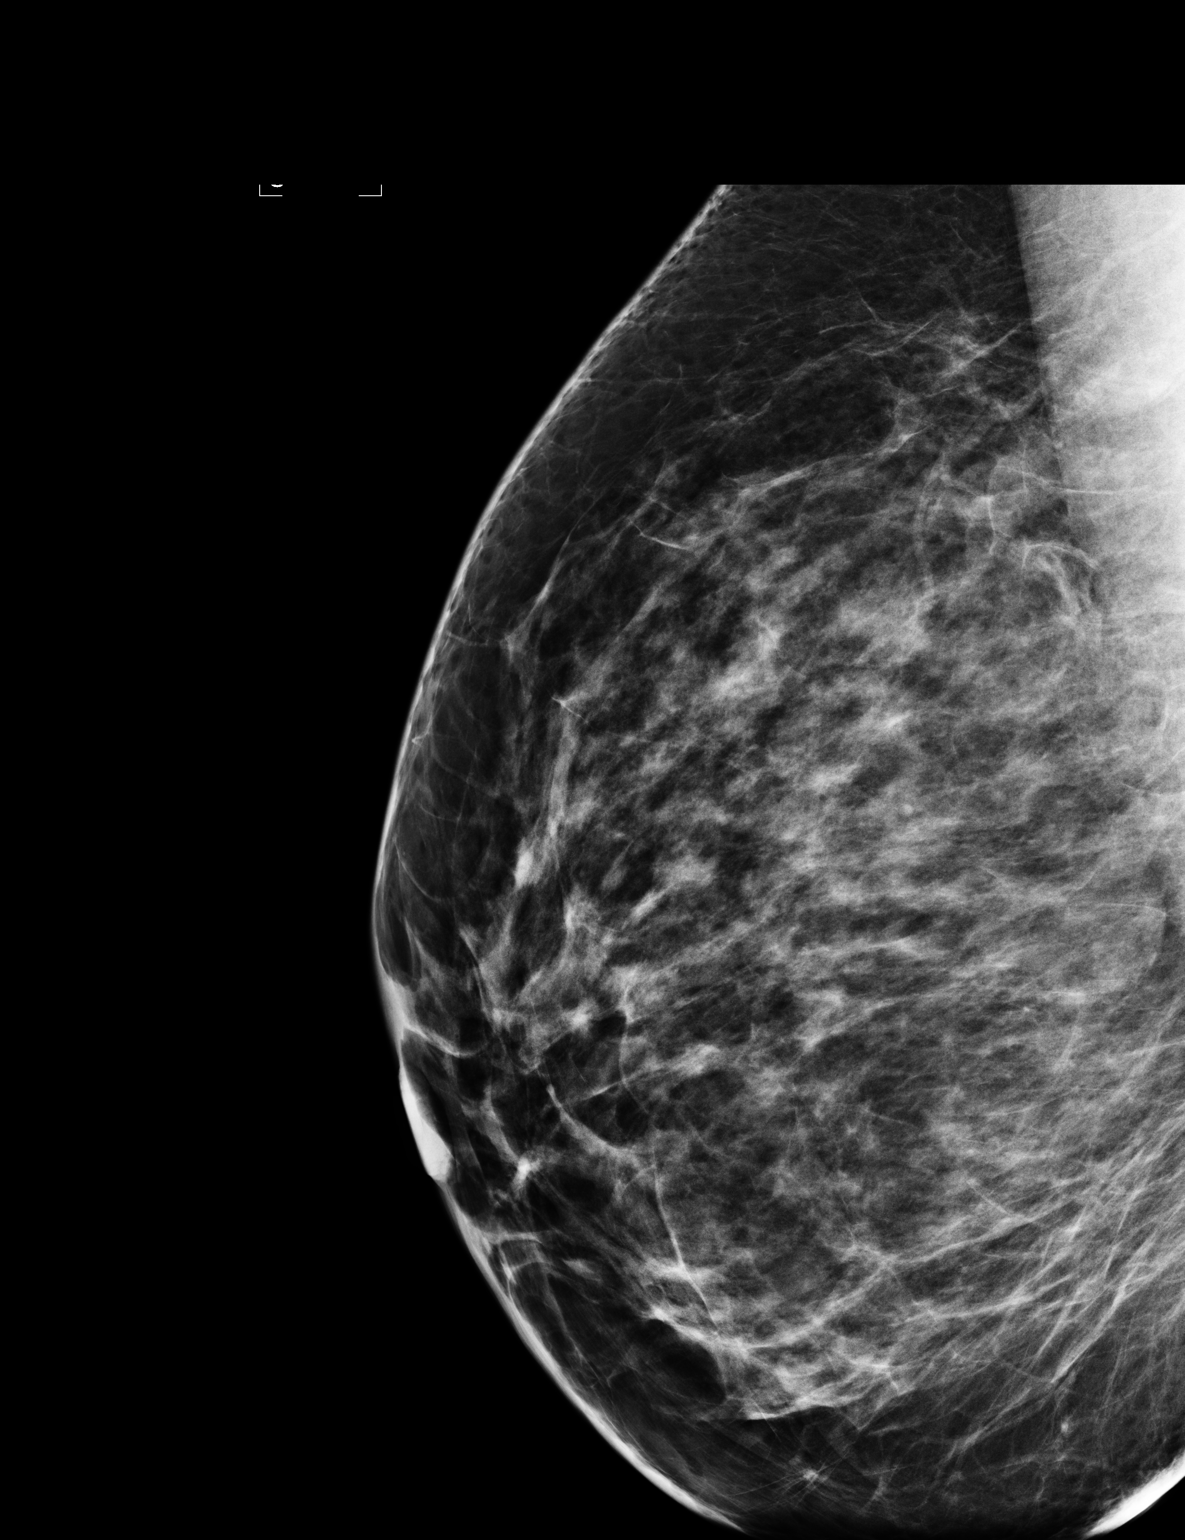

[2 of 2 positions shown; findings below may reference images not displayed]

ACR Breast Density Category c: The breast tissue is heterogeneously
dense, which may obscure small masses.
FINDINGS: The initially questioned asymmetry seen in the slightly upper
posterior right breast on the MLO view only resolves on the
additional spot compression MLO view and true lateral view with no
definite correlate seen on the CC view. Findings are consistent with
overlapping breast tissue.

Mammographic images were processed with CAD.
IMPRESSION: Initially questioned right breast asymmetry resolves on the
additional imaging compatible with superimposed tissue. There is no
mammographic evidence of malignancy in the right breast.

RECOMMENDATION:
Recommend routine screening mammography, 4 to the patient will be
due December 2015.

I have discussed the findings and recommendations with the patient.
Results were also provided in writing at the conclusion of the
visit. If applicable, a reminder letter will be sent to the patient
regarding the next appointment.

BI-RADS CATEGORY  1: Negative.

## 2018-04-27 ENCOUNTER — Encounter (HOSPITAL_COMMUNITY): Payer: Self-pay | Admitting: Emergency Medicine

## 2018-04-27 ENCOUNTER — Emergency Department (HOSPITAL_COMMUNITY)
Admission: EM | Admit: 2018-04-27 | Discharge: 2018-04-27 | Disposition: A | Discharge status: Home / Self Care | Payer: 59 | Attending: Emergency Medicine | Admitting: Emergency Medicine

## 2018-04-27 DIAGNOSIS — R52 Pain, unspecified: Secondary | ICD-10-CM

## 2018-04-27 DIAGNOSIS — R5383 Other fatigue: Secondary | ICD-10-CM | POA: Insufficient documentation

## 2018-04-27 DIAGNOSIS — R51 Headache: Secondary | ICD-10-CM | POA: Insufficient documentation

## 2018-04-27 LAB — CBC WITH DIFFERENTIAL/PLATELET
Basophils Absolute: 0.1 10*3/uL (ref 0.0–0.1)
Basophils Relative: 1 %
EOS PCT: 3 %
Eosinophils Absolute: 0.2 10*3/uL (ref 0.0–0.7)
HCT: 38.8 % (ref 36.0–46.0)
Hemoglobin: 13.5 g/dL (ref 12.0–15.0)
LYMPHS ABS: 1.8 10*3/uL (ref 0.7–4.0)
Lymphocytes Relative: 30 %
MCH: 31.3 pg (ref 26.0–34.0)
MCHC: 34.8 g/dL (ref 30.0–36.0)
MCV: 90 fL (ref 78.0–100.0)
MONO ABS: 0.7 10*3/uL (ref 0.1–1.0)
MONOS PCT: 11 %
Neutro Abs: 3.3 10*3/uL (ref 1.7–7.7)
Neutrophils Relative %: 55 %
PLATELETS: 270 10*3/uL (ref 150–400)
RBC: 4.31 MIL/uL (ref 3.87–5.11)
RDW: 12.2 % (ref 11.5–15.5)
WBC: 6 10*3/uL (ref 4.0–10.5)

## 2018-04-27 LAB — COMPREHENSIVE METABOLIC PANEL
ALT: 17 U/L (ref 14–54)
ANION GAP: 5 (ref 5–15)
AST: 20 U/L (ref 15–41)
Albumin: 4 g/dL (ref 3.5–5.0)
Alkaline Phosphatase: 77 U/L (ref 38–126)
BUN: 12 mg/dL (ref 6–20)
CO2: 28 mmol/L (ref 22–32)
CREATININE: 0.57 mg/dL (ref 0.44–1.00)
Calcium: 8.6 mg/dL — ABNORMAL LOW (ref 8.9–10.3)
Chloride: 105 mmol/L (ref 101–111)
Glucose, Bld: 105 mg/dL — ABNORMAL HIGH (ref 65–99)
Potassium: 3.8 mmol/L (ref 3.5–5.1)
Sodium: 138 mmol/L (ref 135–145)
Total Bilirubin: 1.1 mg/dL (ref 0.3–1.2)
Total Protein: 7.4 g/dL (ref 6.5–8.1)

## 2018-04-27 LAB — URINALYSIS, ROUTINE W REFLEX MICROSCOPIC
BILIRUBIN URINE: NEGATIVE
Bacteria, UA: NONE SEEN
GLUCOSE, UA: NEGATIVE mg/dL
Ketones, ur: NEGATIVE mg/dL
Leukocytes, UA: NEGATIVE
NITRITE: NEGATIVE
PH: 5 (ref 5.0–8.0)
Protein, ur: NEGATIVE mg/dL
SPECIFIC GRAVITY, URINE: 1.029 (ref 1.005–1.030)

## 2018-04-27 LAB — TSH: TSH: 1.065 u[IU]/mL (ref 0.350–4.500)

## 2018-04-27 MED ORDER — ACETAMINOPHEN 500 MG PO TABS
1000.0000 mg | ORAL_TABLET | Freq: Once | ORAL | Status: AC
  Administered 2018-04-27: 1000 mg via ORAL
  Filled 2018-04-27: qty 2

## 2018-04-27 NOTE — ED Notes (Signed)
EDP at bedside updating patient and family. 

## 2018-04-27 NOTE — ED Provider Notes (Signed)
[facility] EMERGENCY DEPARTMENT Provider Note   CSN: [id number] Arrival date & time: 04/27/18  0745     History   Chief Complaint Chief Complaint  Patient presents with  . Generalized Body Aches    HPI Mary Gallagher is a 47 y.o. female.  Patient with no medical hx presents with body aches.  Patient is had intermittent joint pains for the past few weeks. No fevers or chills.patient's had gradual onset headache and general fatigue for a few months. No significant sick contacts. Patient has not had any rashes or tick bites known.     Past Medical History:  Diagnosis Date  . Hyperlipidemia   . Peri-menopausal 03/28/2016  . Pollen allergies 03/28/2016    Patient Active Problem List   Diagnosis Date Noted  . Pollen allergies 03/28/2016  . Peri-menopausal 03/28/2016    Past Surgical History:  Procedure Laterality Date  . CESAREAN SECTION  [id number]  . HERNIA REPAIR       OB History    Gravida  2   Para  2   Term      Preterm      AB      Living  2     SAB      TAB      Ectopic      Multiple      Live Births               Home Medications    Prior to Admission medications   Medication Sig Start Date End Date Taking? Authorizing Provider  acetaminophen (TYLENOL) 500 MG tablet Take 500 mg by mouth as needed.   Yes [provider]    Family History Family History  Problem Relation Age of Onset  . Other Mother        fell down stairs and hit head  . Hypertension Father   . Diabetes Brother   . Diabetes Sister   . Cancer Sister        breast  . Cancer Sister        thyroid  . Other Brother        stomach problems    Social History Social History   Tobacco Use  . Smoking status: Never Smoker  . Smokeless tobacco: Never Used  Substance Use Topics  . Alcohol use: No  . Drug use: No     Allergies   Penicillins   Review of Systems Review of Systems  Constitutional: Positive for fatigue. Negative for chills and  fever.  HENT: Negative for congestion.   Eyes: Negative for visual disturbance.  Respiratory: Negative for shortness of breath.   Cardiovascular: Negative for chest pain.  Gastrointestinal: Negative for abdominal pain and vomiting.  Genitourinary: Positive for dysuria. Negative for flank pain.  Musculoskeletal: Positive for arthralgias. Negative for back pain, neck pain and neck stiffness.  Skin: Negative for rash.  Neurological: Positive for headaches. Negative for light-headedness.     Physical Exam Updated Vital Signs BP 135/82 (BP Location: Right Arm)   Pulse 62   Temp 97.9 F (36.6 C) (Oral)   Resp 16   Ht 5' (1.524 m)   Wt 70.3 kg (155 lb)   SpO2 100%   BMI 30.27 kg/m   Physical Exam  Constitutional: She is oriented to person, place, and time. She appears well-developed and well-nourished.  HENT:  Head: Normocephalic and atraumatic.  No meningismus  Eyes: Conjunctivae are normal. Right eye exhibits no discharge. Left eye exhibits  no discharge.  Neck: Normal range of motion. Neck supple. No tracheal deviation present.  Cardiovascular: Normal rate and regular rhythm.  Pulmonary/Chest: Effort normal and breath sounds normal.  Abdominal: Soft. She exhibits no distension. There is no tenderness. There is no guarding.  Musculoskeletal: She exhibits no edema.  Neurological: She is alert and oriented to person, place, and time. No cranial nerve deficit.  Skin: Skin is warm. No rash noted.  Psychiatric: She has a normal mood and affect.  Nursing note and vitals reviewed.    ED Treatments / Results  Labs (all labs ordered are listed, but only abnormal results are displayed) Labs Reviewed  COMPREHENSIVE METABOLIC PANEL - Abnormal; Notable for the following components:      Result Value   Glucose, Bld 105 (*)    Calcium 8.6 (*)    All other components within normal limits  URINALYSIS, ROUTINE W REFLEX MICROSCOPIC - Abnormal; Notable for the following components:    APPearance HAZY (*)    Hgb urine dipstick MODERATE (*)    All other components within normal limits  CBC WITH DIFFERENTIAL/PLATELET  TSH  B. BURGDORFI ANTIBODIES    EKG None  Radiology No results found.  Procedures Procedures (including critical care time)  Medications Ordered in ED Medications  acetaminophen (TYLENOL) tablet 1,000 mg (1,000 mg Oral Given 04/27/18 0911)     Initial Impression / Assessment and Plan / ED Course  I have reviewed the triage vital signs and the nursing notes.  Pertinent labs & imaging results that were available during my care of the patient were reviewed by me and considered in my medical decision making (see chart for details).    Ell-appearing patient presents with multiple different symptoms for the past few months. Patient well-appearing on exam no signs of serious bacterial infection. Patient has not had a rash and there is no rash on exam. No tick bites. Discussed screening blood work and a follow-up Lyme disease results in thyroid results with primary doctor in the next 48 hours. No indication to start doxycycline this time.  Results and differential diagnosis were discussed with the patient/parent/guardian. Xrays were independently reviewed by myself.  Close follow up outpatient was discussed, comfortable with the plan.   Medications  acetaminophen (TYLENOL) tablet 1,000 mg (1,000 mg Oral Given 04/27/18 0911)    Vitals:   04/27/18 0753 04/27/18 0754 04/27/18 1014  BP:  135/82 138/74  Pulse:  62 68  Resp:  16 18  Temp:  97.9 F (36.6 C) 98.1 F (36.7 C)  TempSrc:  Oral   SpO2:  100% 98%  Weight: 70.3 kg (155 lb)    Height: 5' (1.524 m)      Final diagnoses:  Body aches     Final Clinical Impressions(s) / ED Diagnoses   Final diagnoses:  Body aches    ED Discharge Orders    None       Blane Ohara, MD 04/27/18 [phone]

## 2018-04-27 NOTE — ED Triage Notes (Signed)
Pt reports generalized body aches, headache, and fatigue for several months.  Is concerned she may have lyme disease.

## 2018-04-27 NOTE — Discharge Instructions (Signed)
Follow up with [facility] and local doctor for results of blood work and reassessment.  If you were given medicines take as directed.  If you are on coumadin or contraceptives realize their levels and effectiveness is altered by many different medicines.  If you have any reaction (rash, tongues swelling, other) to the medicines stop taking and see a physician.   Take tylenol and motrin as needed for aches.  If your blood pressure was elevated in the ER make sure you follow up for management with a primary doctor or return for chest pain, shortness of breath or stroke symptoms.  Please follow up as directed and return to the ER or see a physician for new or worsening symptoms.  Thank you. Vitals:   04/27/18 0753 04/27/18 0754  BP:  135/82  Pulse:  62  Resp:  16  Temp:  97.9 F (36.6 C)  TempSrc:  Oral  SpO2:  100%  Weight: 70.3 kg (155 lb)   Height: 5' (1.524 m)

## 2018-04-28 LAB — B. BURGDORFI ANTIBODIES: B burgdorferi Ab IgG+IgM: <0.91 {ISR} (ref 0.00–0.90)

## 2021-01-15 ENCOUNTER — Other Ambulatory Visit (HOSPITAL_COMMUNITY): Payer: Self-pay | Admitting: *Deleted

## 2021-01-15 DIAGNOSIS — Z1231 Encounter for screening mammogram for malignant neoplasm of breast: Secondary | ICD-10-CM

## 2021-01-25 ENCOUNTER — Ambulatory Visit (HOSPITAL_COMMUNITY)
Admission: RE | Admit: 2021-01-25 | Discharge: 2021-01-25 | Disposition: A | Discharge status: Home / Self Care | Payer: Self-pay | Source: Ambulatory Visit | Attending: *Deleted | Admitting: *Deleted

## 2021-01-25 ENCOUNTER — Other Ambulatory Visit: Payer: Self-pay

## 2021-01-25 DIAGNOSIS — Z1231 Encounter for screening mammogram for malignant neoplasm of breast: Secondary | ICD-10-CM | POA: Insufficient documentation

## 2022-09-04 ENCOUNTER — Other Ambulatory Visit: Payer: Self-pay | Admitting: Family Medicine

## 2022-09-04 DIAGNOSIS — Z1231 Encounter for screening mammogram for malignant neoplasm of breast: Secondary | ICD-10-CM

## 2022-09-19 ENCOUNTER — Ambulatory Visit (HOSPITAL_COMMUNITY)
Admission: RE | Admit: 2022-09-19 | Discharge: 2022-09-19 | Disposition: A | Discharge status: Home / Self Care | Payer: Self-pay | Source: Ambulatory Visit | Attending: Family Medicine | Admitting: Family Medicine

## 2022-09-19 DIAGNOSIS — Z1231 Encounter for screening mammogram for malignant neoplasm of breast: Secondary | ICD-10-CM | POA: Insufficient documentation

## 2023-10-07 ENCOUNTER — Telehealth: Payer: Self-pay

## 2023-10-07 NOTE — Telephone Encounter (Signed)
Attempted wellness and follow up call to care connect client. Care Connect has expired as of 07/25/23.  Has been seen at [facility], but was last seen 11/04/22 and no other appointments.  Called with [facility] interpreter. No answer, left message requesting return call to care connect.   Francee Nodal RN [employer]

## 2024-03-18 ENCOUNTER — Encounter: Payer: Self-pay | Admitting: Family Medicine

## 2024-03-19 ENCOUNTER — Telehealth: Payer: Self-pay | Admitting: *Deleted
